# Patient Record
Sex: Female | Born: 1980 | Hispanic: Yes | Marital: Married | Smoking: Never smoker
Health system: Southern US, Community
[De-identification: ages and names within clinical notes are randomized; demographics above are authoritative.]

---

## 2010-05-24 ENCOUNTER — Emergency Department: Payer: Self-pay | Admitting: Emergency Medicine

## 2014-09-13 ENCOUNTER — Ambulatory Visit: Payer: Self-pay | Admitting: Physician Assistant

## 2015-01-19 ENCOUNTER — Inpatient Hospital Stay: Payer: Self-pay

## 2015-01-19 LAB — CBC WITH DIFFERENTIAL/PLATELET
BASOS PCT: 0.4 %
Basophil #: 0 10*3/uL (ref 0.0–0.1)
EOS PCT: 1.3 %
Eosinophil #: 0.1 10*3/uL (ref 0.0–0.7)
HCT: 39.5 % (ref 35.0–47.0)
HGB: 13.1 g/dL (ref 12.0–16.0)
LYMPHS PCT: 22.8 %
Lymphocyte #: 2.1 10*3/uL (ref 1.0–3.6)
MCH: 30.7 pg (ref 26.0–34.0)
MCHC: 33.2 g/dL (ref 32.0–36.0)
MCV: 93 fL (ref 80–100)
MONOS PCT: 5.3 %
Monocyte #: 0.5 x10 3/mm (ref 0.2–0.9)
NEUTROS ABS: 6.4 10*3/uL (ref 1.4–6.5)
Neutrophil %: 70.2 %
Platelet: 147 10*3/uL — ABNORMAL LOW (ref 150–440)
RBC: 4.27 10*6/uL (ref 3.80–5.20)
RDW: 16.4 % — ABNORMAL HIGH (ref 11.5–14.5)
WBC: 9.2 10*3/uL (ref 3.6–11.0)

## 2015-01-20 LAB — HEMATOCRIT: HCT: 31.4 % — ABNORMAL LOW (ref 35.0–47.0)

## 2015-04-29 NOTE — H&P (Signed)
L&D Evaluation:  History:  HPI 34 y/o [id number] @ 39/5 EDC 01/21/15 here with strong contractions and urge to push. [facility], HX twins 34 yrs old. Denies leaking fluid, normal show. GBS negative.   Presents with contractions   Patient's Medical History No Chronic Illness   Patient's Surgical History none   Medications Pre Natal Vitamins   Allergies NKDA   Social History none   Family History Non-Contributory   ROS:  ROS All systems were reviewed.  HEENT, CNS, GI, GU, Respiratory, CV, Renal and Musculoskeletal systems were found to be normal.   Exam:  Vital Signs stable   Urine Protein not completed   General active labor   Mental Status clear   Chest clear   Heart normal sinus rhythm   Abdomen gravid, non-tender   Estimated Fetal Weight Large for gestational age   Fetal Position vtx   Fundal Height term+   Back no CVAT   Edema no edema   Reflexes 1+   Clonus negative   Pelvic no external lesions, 9cm vtx @ -1  BBOW nl show   Mebranes Ruptured   Description clear, AROM 0538   FHT normal rate with no decels   FHT Description baseline 130's avg variability   Fetal Heart Rate 144   Ucx regular   Ucx Frequency 2 min   Length of each Contraction 60 seconds   Skin dry   Lymph no lymphadenopathy   Impression:  Impression active labor   Plan:  Plan EFM/NST, monitor contractions and for cervical change   Comments Admitted, knows what to expect. Declines pain meds, breathing through well. Family, partner at bedside supportive.   Electronic Signatures: Albertina ParrLugiano, Zorianna Taliaferro B (CNM)  (Signed 31-Jan-16 06:40)  Authored: L&D Evaluation   Last Updated: 31-Jan-16 06:40 by Albertina ParrLugiano, Jaxyn Rout B (CNM)

## 2017-03-03 ENCOUNTER — Other Ambulatory Visit: Payer: Self-pay | Admitting: Family Medicine

## 2020-10-13 ENCOUNTER — Emergency Department: Payer: No Typology Code available for payment source

## 2020-10-13 ENCOUNTER — Encounter: Payer: Self-pay | Admitting: *Deleted

## 2020-10-13 ENCOUNTER — Emergency Department
Admission: EM | Admit: 2020-10-13 | Discharge: 2020-10-13 | Disposition: A | Discharge status: Home / Self Care | Payer: No Typology Code available for payment source | Attending: Student in an Organized Health Care Education/Training Program | Admitting: Student in an Organized Health Care Education/Training Program

## 2020-10-13 ENCOUNTER — Other Ambulatory Visit: Payer: Self-pay

## 2020-10-13 DIAGNOSIS — R519 Headache, unspecified: Secondary | ICD-10-CM | POA: Diagnosis not present

## 2020-10-13 DIAGNOSIS — S0083XA Contusion of other part of head, initial encounter: Secondary | ICD-10-CM | POA: Diagnosis not present

## 2020-10-13 DIAGNOSIS — S0993XA Unspecified injury of face, initial encounter: Secondary | ICD-10-CM | POA: Diagnosis present

## 2020-10-13 MED ORDER — MELOXICAM 15 MG PO TABS: 15.0000 mg | ORAL_TABLET | Freq: Every day | ORAL | 0 refills | Status: AC
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
## 2020-10-13 NOTE — ED Notes (Signed)
Pt spanish speaking only. In person interpreter requested
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
## 2020-10-13 NOTE — ED Notes (Signed)
Interpreter, Marchelle Folks, at bedside
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
